# Patient Record
Sex: Female | Born: 1990 | Race: Asian | Hispanic: No | Marital: Single | Smoking: Never smoker
Health system: Southern US, Community
[De-identification: ages and names within clinical notes are randomized; demographics above are authoritative.]

## PROBLEM LIST (undated history)

## (undated) DIAGNOSIS — F32A Depression, unspecified: Secondary | ICD-10-CM

## (undated) DIAGNOSIS — F329 Major depressive disorder, single episode, unspecified: Secondary | ICD-10-CM

## (undated) HISTORY — DX: Major depressive disorder, single episode, unspecified: F32.9

## (undated) HISTORY — DX: Depression, unspecified: F32.A

---
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
## 2011-02-21 LAB — BASIC METABOLIC PANEL
BUN: 16 mg/dL (ref 6–23)
Chloride: 107 mEq/L (ref 96–112)
Creatinine, Ser: 0.92 mg/dL (ref 0.4–1.2)
GFR calc non Af Amer: >60 mL/min (ref >60)

## 2011-02-21 LAB — CBC
MCHC: 32.6 g/dL (ref 30.0–36.0)
MCV: 78.6 fL (ref 78.0–100.0)
Platelets: 219 10*3/uL (ref 150–400)
RDW: 13.8 % (ref 11.5–15.5)
WBC: 7.5 10*3/uL (ref 4.0–10.5)

## 2011-02-21 LAB — URINALYSIS, ROUTINE W REFLEX MICROSCOPIC
Bilirubin Urine: NEGATIVE
Glucose, UA: NEGATIVE mg/dL
Hgb urine dipstick: NEGATIVE
Ketones, ur: 15 mg/dL — AB
Leukocytes, UA: NEGATIVE
Nitrite: NEGATIVE
Protein, ur: 30 mg/dL — AB
Specific Gravity, Urine: 1.029 (ref 1.005–1.030)
Urobilinogen, UA: 0.2 mg/dL (ref 0.0–1.0)
pH: 6 (ref 5.0–8.0)

## 2011-02-21 LAB — HEPATIC FUNCTION PANEL
ALT: 9 U/L (ref 0–35)
AST: 17 U/L (ref 0–37)
Alkaline Phosphatase: 56 U/L (ref 39–117)
Bilirubin, Direct: 0.1 mg/dL (ref 0.0–0.3)
Total Bilirubin: 0.5 mg/dL (ref 0.3–1.2)

## 2011-02-21 LAB — URINE MICROSCOPIC-ADD ON

## 2011-02-21 LAB — SALICYLATE LEVEL: Salicylate Lvl: <4 mg/dL (ref 2.8–20.0)

## 2011-02-21 LAB — RAPID URINE DRUG SCREEN, HOSP PERFORMED
Amphetamines: NOT DETECTED
Barbiturates: NOT DETECTED
Benzodiazepines: NOT DETECTED
Cocaine: NOT DETECTED
Opiates: NOT DETECTED
Tetrahydrocannabinol: NOT DETECTED

## 2011-02-21 LAB — PREGNANCY, URINE: Preg Test, Ur: NEGATIVE

## 2011-02-21 LAB — ETHANOL: Alcohol, Ethyl (B): <5 mg/dL (ref 0–10)

## 2011-02-21 LAB — DIFFERENTIAL
Basophils Absolute: 0 10*3/uL (ref 0.0–0.1)
Basophils Relative: 0 % (ref 0–1)
Eosinophils Relative: 1 % (ref 0–5)
Lymphocytes Relative: 21 % (ref 12–46)

## 2011-02-21 LAB — TRICYCLICS SCREEN, URINE: TCA Scrn: NOT DETECTED

## 2011-02-21 LAB — RPR: RPR Ser Ql: NONREACTIVE

## 2011-02-21 LAB — GC/CHLAMYDIA PROBE AMP, URINE: Chlamydia, Swab/Urine, PCR: NEGATIVE

## 2011-02-21 LAB — ACETAMINOPHEN LEVEL: Acetaminophen (Tylenol), Serum: <10 ug/mL — ABNORMAL LOW (ref 10–30)

## 2012-03-27 ENCOUNTER — Ambulatory Visit: Payer: BC Managed Care – PPO

## 2012-03-27 ENCOUNTER — Ambulatory Visit (INDEPENDENT_AMBULATORY_CARE_PROVIDER_SITE_OTHER): Payer: BC Managed Care – PPO | Admitting: Family Medicine

## 2012-03-27 VITALS — BP 109/75 | HR 86 | Temp 98.1°F | Resp 16 | Ht 62.0 in | Wt 94.0 lb

## 2012-03-27 DIAGNOSIS — R7611 Nonspecific reaction to tuberculin skin test without active tuberculosis: Secondary | ICD-10-CM

## 2012-03-27 NOTE — Progress Notes (Signed)
  Subjective:    Patient ID: Loretta Carter, female    DOB: [birth year], 21 y.o.   MRN: [id number]  HPI 21 yo female from [place] here following positive TB test. Read yesterday at [facility] on [address].  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  Came to [place] in 2000.  Thinks she had a TB test last year and it was negative.  DEnies family members with known TB.   Feels well, no night sweats or unexplained weight loss.   Does state in the morning she will cough and does have blood in the cough.  Occurs daily for about a year.  Only in the morning, not later in the day.     Review of Systems Negative except as per HPI     Objective:   Physical Exam  Constitutional: She appears well-developed.  Cardiovascular: Normal rate, regular rhythm, normal heart sounds and intact distal pulses.   Pulmonary/Chest: Effort normal and breath sounds normal. No respiratory distress. She has no wheezes. She has no rales.  Lymphadenopathy:    She has no cervical adenopathy.  Neurological: She is alert.  Skin: Skin is warm.    [facility] Primary radiology reading by Dr. 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  Referral to health department TB clinic.  Also gave number of the c linic to the patient so she may call if she has not heard from [place] by the beginning of next week.

## 2012-03-28 NOTE — Progress Notes (Signed)
Copy mailed to pt. 

## 2012-04-29 ENCOUNTER — Ambulatory Visit (INDEPENDENT_AMBULATORY_CARE_PROVIDER_SITE_OTHER): Payer: BC Managed Care – PPO | Admitting: Obstetrics and Gynecology

## 2012-04-29 ENCOUNTER — Encounter: Payer: Self-pay | Admitting: Obstetrics and Gynecology

## 2012-04-29 VITALS — BP 98/62 | Ht 63.0 in | Wt 96.0 lb

## 2012-04-29 DIAGNOSIS — R6889 Other general symptoms and signs: Secondary | ICD-10-CM

## 2012-04-29 DIAGNOSIS — Z309 Encounter for contraceptive management, unspecified: Secondary | ICD-10-CM

## 2012-04-29 DIAGNOSIS — IMO0001 Reserved for inherently not codable concepts without codable children: Secondary | ICD-10-CM | POA: Insufficient documentation

## 2012-04-29 DIAGNOSIS — F329 Major depressive disorder, single episode, unspecified: Secondary | ICD-10-CM

## 2012-04-29 LAB — CBC
HCT: 39.4 % (ref 36.0–46.0)
Hemoglobin: 12.5 g/dL (ref 12.0–15.0)
MCH: 24.9 pg — ABNORMAL LOW (ref 26.0–34.0)
MCHC: 31.7 g/dL (ref 30.0–36.0)
MCV: 78.5 fL (ref 78.0–100.0)
RDW: 14.7 % (ref 11.5–15.5)

## 2012-04-29 NOTE — Progress Notes (Signed)
The patient reports:no complaints  Contraception: wanting Nexplanon  Last mammogram: not applicable  Last pap: not applicable   GC/Chlamydia cultures offered: requested HIV/RPR/HbsAg offered:  requested HSV 1 and 2 glycoprotein offered: requested  Menstrual cycle regular and monthly: Yes Menstrual flow normal: Yes  Urinary symptoms: none Normal bowel movements: Yes Reports abuse at home: No

## 2012-04-29 NOTE — Patient Instructions (Signed)
Contraceptive Implant Information A contraceptive implant is a plastic rod that is inserted under the skin. It is usually inserted under the skin of your upper arm. It continually releases small amounts of progestin (synthetic progesterone) into the bloodstream. This prevents an egg from being released from the ovary. It also thickens the cervical mucus to prevent sperm from entering the cervix, and it thins the uterine lining to prevent a fertilized egg from attaching to the uterus. They can be effective for up to 3 years. Implants do not provide protection against sexually transmitted diseases (STDs).  The procedure to insert an implant usually takes about 10 minutes. There may be minor bruising, swelling, and discomfort at the insertion site for a couple days. The implant begins to work within the first day. Other contraceptive protection should be used for 2 weeks. Follow up with your caregiver to get rechecked as directed. Your caregiver will make sure you are a good candidate for the contraceptive implant. Discuss with your caregiver the possible side effects of the implant ADVANTAGES  It prevents pregnancy for up to 3 years.   It is easily reversible.   It is convenient.   The progestins may protect against uterine and ovarian cancer.   It can be used when breastfeeding.   It can be used by women who cannot take estrogen.  DISADVANTAGES  You may have irregular or unplanned vaginal bleeding.   You may develop side effects, including headache, weight gain, acne, breast tenderness, or mood changes.   You may have tissue or nerve damage after insertion (rare).   It may be difficult and uncomfortable to remove.   Certain medications may interfere with the effectiveness of the implants.  REMOVAL OF IMPLANT The implant should be removed in 3 years or as directed by your caregiver. The implants effect wears off in a few hours after removal. Your ability to get pregnant (fertility) is  restored within a couple of weeks. New implants can be inserted as soon as the old ones are removed if desired. DO NOT GET THE IMPLANT IF:   You are pregnant.   You have a history of breast cancer, osteoporosis, blood clots, heart disease, diabetes, high blood pressure, liver disease, tumors, or stroke.    You have undiagnosed vaginal bleeding.   You have overly sensitive to certain parts of the implant.  Document Released: 11/15/2011 Document Reviewed: 11/13/2011 [employer] Patient Information 2012 [employer]. 

## 2012-04-29 NOTE — Progress Notes (Signed)
Subjective:    Loretta Carter is a 21 y.o. female, P0 who presents for an annual exam and to discuss Nexplanon for contraception   History   Social History  . Marital Status: Single    Spouse Name: N/A    Number of Children: N/A  . Years of Education: N/A   Social History Main Topics  . Smoking status: Never Smoker   . Smokeless tobacco: Never Used  . Alcohol Use: No  . Drug Use: No  . Sexually Active: Yes    Birth Control/ Protection: None   Other Topics Concern  . None   Social History Narrative  . None    Menstrual cycle:   LMP: Patient's last menstrual period was 04/25/2012.           Cycle: Regular, monthly with normal flow and no severe dysmenorrha  The following portions of the patient's history were reviewed and updated as appropriate: allergies, current medications, past family history, past medical history, past social history, past surgical history and problem list.  Review of Systems Pertinent items are noted in HPI. Breast:Negative for breast lump,nipple discharge or nipple retraction Gastrointestinal: Negative for abdominal pain, change in bowel habits or rectal bleeding Urinary:negative Is sexually active Denies abuse   Objective:    BP 98/62  Ht 5\' 3"  (1.6 m)  Wt 96 lb (43.545 kg)  BMI 17.01 kg/m2  LMP 04/25/2012    Weight:  Wt Readings from Last 1 Encounters:  04/29/12 96 lb (43.545 kg)          BMI: Body mass index is 17.01 kg/(m^2).  General Appearance: Alert, appropriate appearance for age. No acute distress HEENT: Grossly normal Neck / Thyroid: Supple, no masses, nodes or enlargement Lungs: clear to auscultation bilaterally Back: No CVA tenderness Breast Exam: No masses or nodes.No dimpling, nipple retraction or discharge. Cardiovascular: Regular rate and rhythm. S1, S2, no murmur Gastrointestinal: Soft, non-tender, no masses or organomegaly Pelvic Exam: Exam deferred. pt having menses, and requests deferral of pelvic Rectovaginal: not  indicated Lymphatic Exam: Non-palpable nodes in neck, clavicular, axillary, or inguinal regions Skin: no rash or abnormalities Neurologic: Normal gait and speech, no tremor  Psychiatric: Alert and oriented, appropriate affect. Has hx of depression in 2011. Denies suicidal or homicidal ideation  Wet Prep:no sx Urinalysis:not applicable UPT: Not done   Assessment:    Contraceptive management    Plan:    pap smear at age 8 additional lab tests per orders, CBC, vitD STD screening: gc/chl on urine.  HIV, RPR Contraception:REQUESTS Nexplanon, will precert      Chasten Blaze PMD

## 2012-04-30 LAB — RPR

## 2012-05-13 ENCOUNTER — Telehealth: Payer: Self-pay

## 2012-05-13 NOTE — Telephone Encounter (Signed)
Tc to pt per GC/Ct that was not performed at last visit. Rec testing be performed per vph. Pt aware HIV and RPR-both neg. Pt agrees. Will have done at appt sched 05/15/12 with ep.

## 2012-05-15 ENCOUNTER — Ambulatory Visit (INDEPENDENT_AMBULATORY_CARE_PROVIDER_SITE_OTHER): Payer: BC Managed Care – PPO | Admitting: Obstetrics and Gynecology

## 2012-05-15 ENCOUNTER — Encounter: Payer: Self-pay | Admitting: Obstetrics and Gynecology

## 2012-05-15 VITALS — BP 90/60 | HR 60 | Ht 63.0 in | Wt 98.0 lb

## 2012-05-15 DIAGNOSIS — Z113 Encounter for screening for infections with a predominantly sexual mode of transmission: Secondary | ICD-10-CM

## 2012-05-15 DIAGNOSIS — Z30017 Encounter for initial prescription of implantable subdermal contraceptive: Secondary | ICD-10-CM

## 2012-05-15 DIAGNOSIS — IMO0001 Reserved for inherently not codable concepts without codable children: Secondary | ICD-10-CM

## 2012-05-15 DIAGNOSIS — Z309 Encounter for contraceptive management, unspecified: Secondary | ICD-10-CM

## 2012-05-15 LAB — POCT URINE PREGNANCY: Preg Test, Ur: NEGATIVE

## 2012-05-15 MED ORDER — ETONOGESTREL 68 MG ~~LOC~~ IMPL
68.0000 mg | DRUG_IMPLANT | Freq: Once | SUBCUTANEOUS | Status: AC
  Administered 2012-05-15: 68 mg via SUBCUTANEOUS

## 2012-05-15 NOTE — Progress Notes (Signed)
21 YO for Nexplanon insertion.  Patient states she has researched the Nexplanon and has no questions except how it works-advised ovulation prevention.   O: Nexplanon inserted per protocol in medial left upper arm without difficulty.  Rod palpated.  Dressed with sterile band-aid and 4 x 4 gauze with Kling pressure bandage.  UPT-negative  A: Nexplanon Insertion  P:Reviewed wound care & S/S of infection    RTO-1 week  Joseh Sjogren, PA-C

## 2012-05-15 NOTE — Patient Instructions (Signed)
Follow up in 1 week  Call [facility] [phone]:  -for temperature of 100.4 degrees Fahrenheit or more -pain not improved with over the counter pain medications (Ibuprofen, Advil, Aleve,     Tylenol or acetaminophen) -for excessive bleeding from insertion site -for excessive swelling redness or green drainage from your insertion site -for any other concerns  Use a back-up method of birth control for the next 4 weeks

## 2012-05-16 LAB — GC/CHLAMYDIA PROBE AMP, URINE
Chlamydia, Swab/Urine, PCR: NEGATIVE
GC Probe Amp, Urine: NEGATIVE

## 2012-05-20 ENCOUNTER — Encounter: Payer: Self-pay | Admitting: Obstetrics and Gynecology

## 2012-05-20 ENCOUNTER — Ambulatory Visit (INDEPENDENT_AMBULATORY_CARE_PROVIDER_SITE_OTHER): Payer: BC Managed Care – PPO | Admitting: Obstetrics and Gynecology

## 2012-05-20 VITALS — BP 106/60 | Ht 63.0 in | Wt 99.0 lb

## 2012-05-20 DIAGNOSIS — IMO0001 Reserved for inherently not codable concepts without codable children: Secondary | ICD-10-CM

## 2012-05-20 DIAGNOSIS — Z309 Encounter for contraceptive management, unspecified: Secondary | ICD-10-CM

## 2012-05-20 NOTE — Progress Notes (Signed)
LMP: 05/17/201 INSERTION DATE: 05/15/2012 INSERTION ARM: left arm.  Pt c/o pain and swelling at insertion site. No bleeding from sight. Pain is improving. No fever or abdominal pain.no vaginal bleeding BP 106/60  Ht 5\' 3"  (1.6 m)  Wt 99 lb (44.906 kg)  BMI 17.54 kg/m2  LMP 04/25/2012 Left arm with palpable implant surrounded by ecchymosis in varying stages of healing Insertion site puncture healing well.  No drainage or purulence Appropriate tenderness around  implant which is palpable.  Patient request results from her last blood tests which included a hemoglobin which was normal and vitamin D level which was normal as well.  Impression: Status post placement of Nexplanon with appropriate recovery course  Recommendation: Patient will followup at annual examination or as needed.

## 2012-09-17 IMAGING — CR DG CHEST 2V
2 series · 2 of 2 positions shown · non-contrast
Comparison: None.

CLINICAL DATA: Positive PPD.

CHEST - 2 VIEW

[PA]
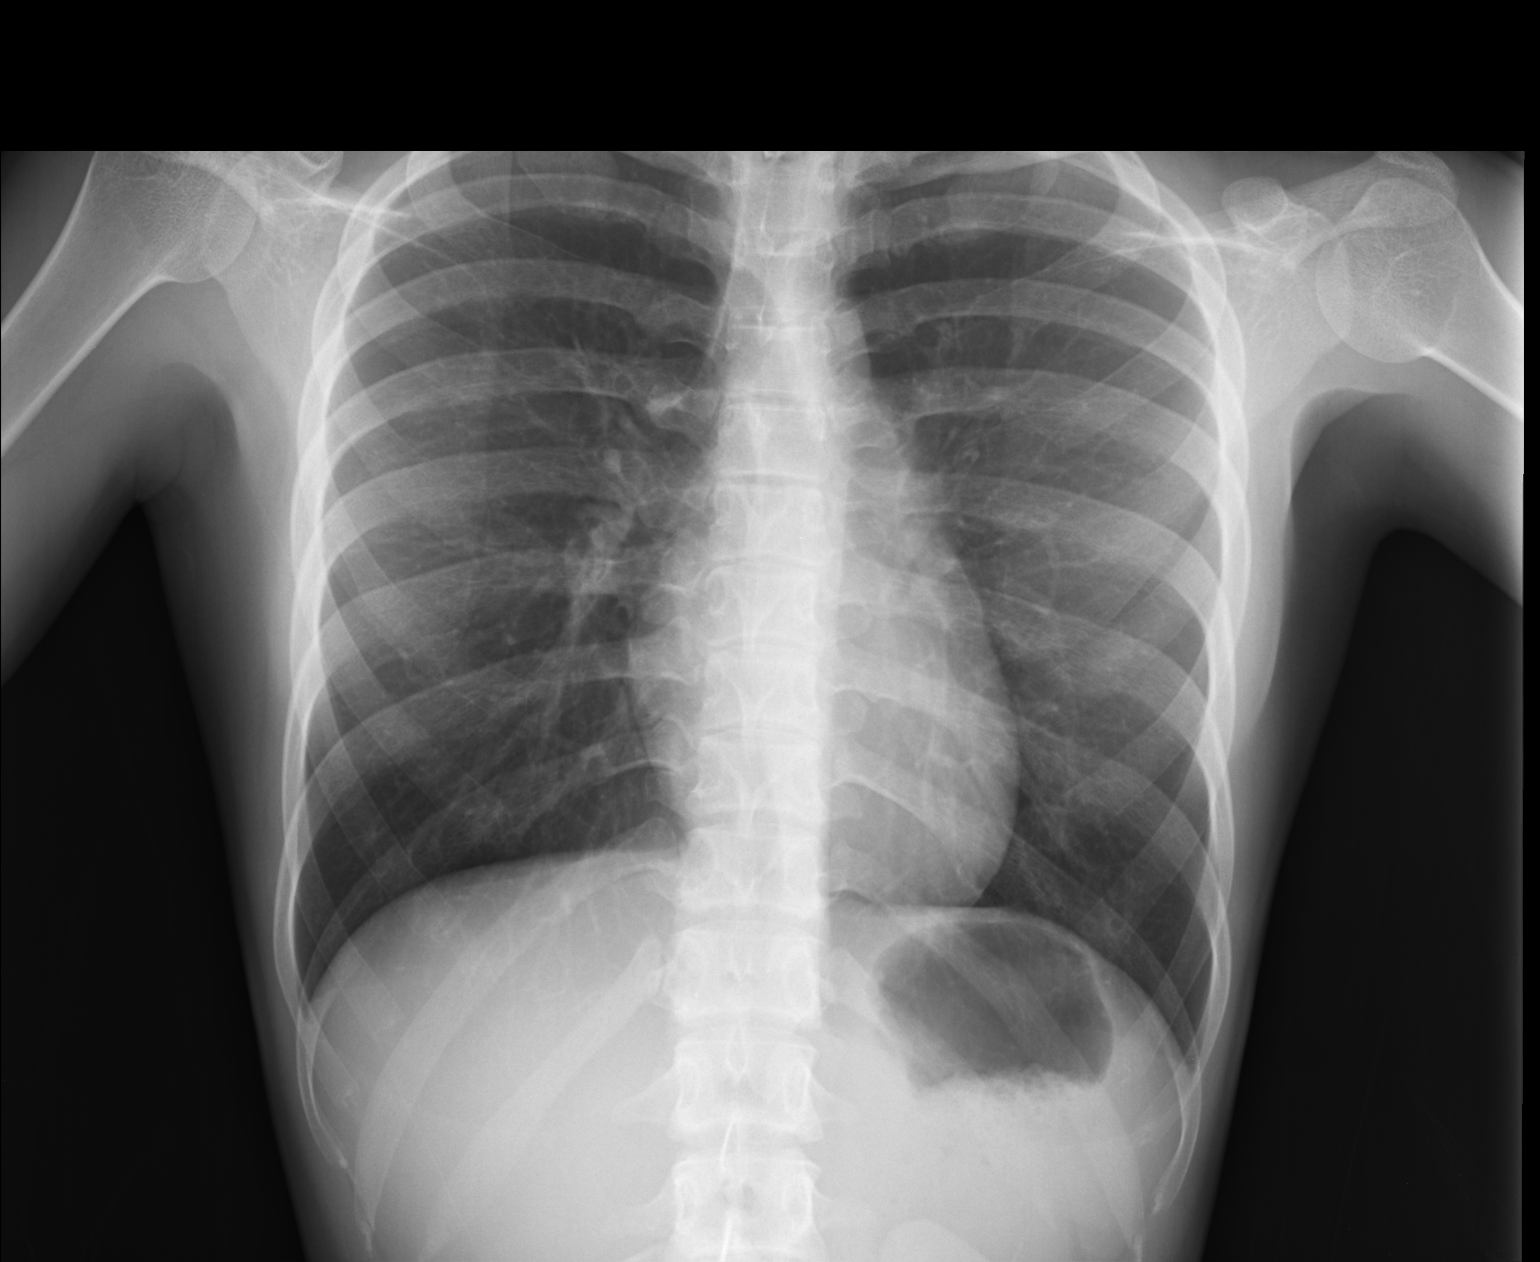

[lateral]
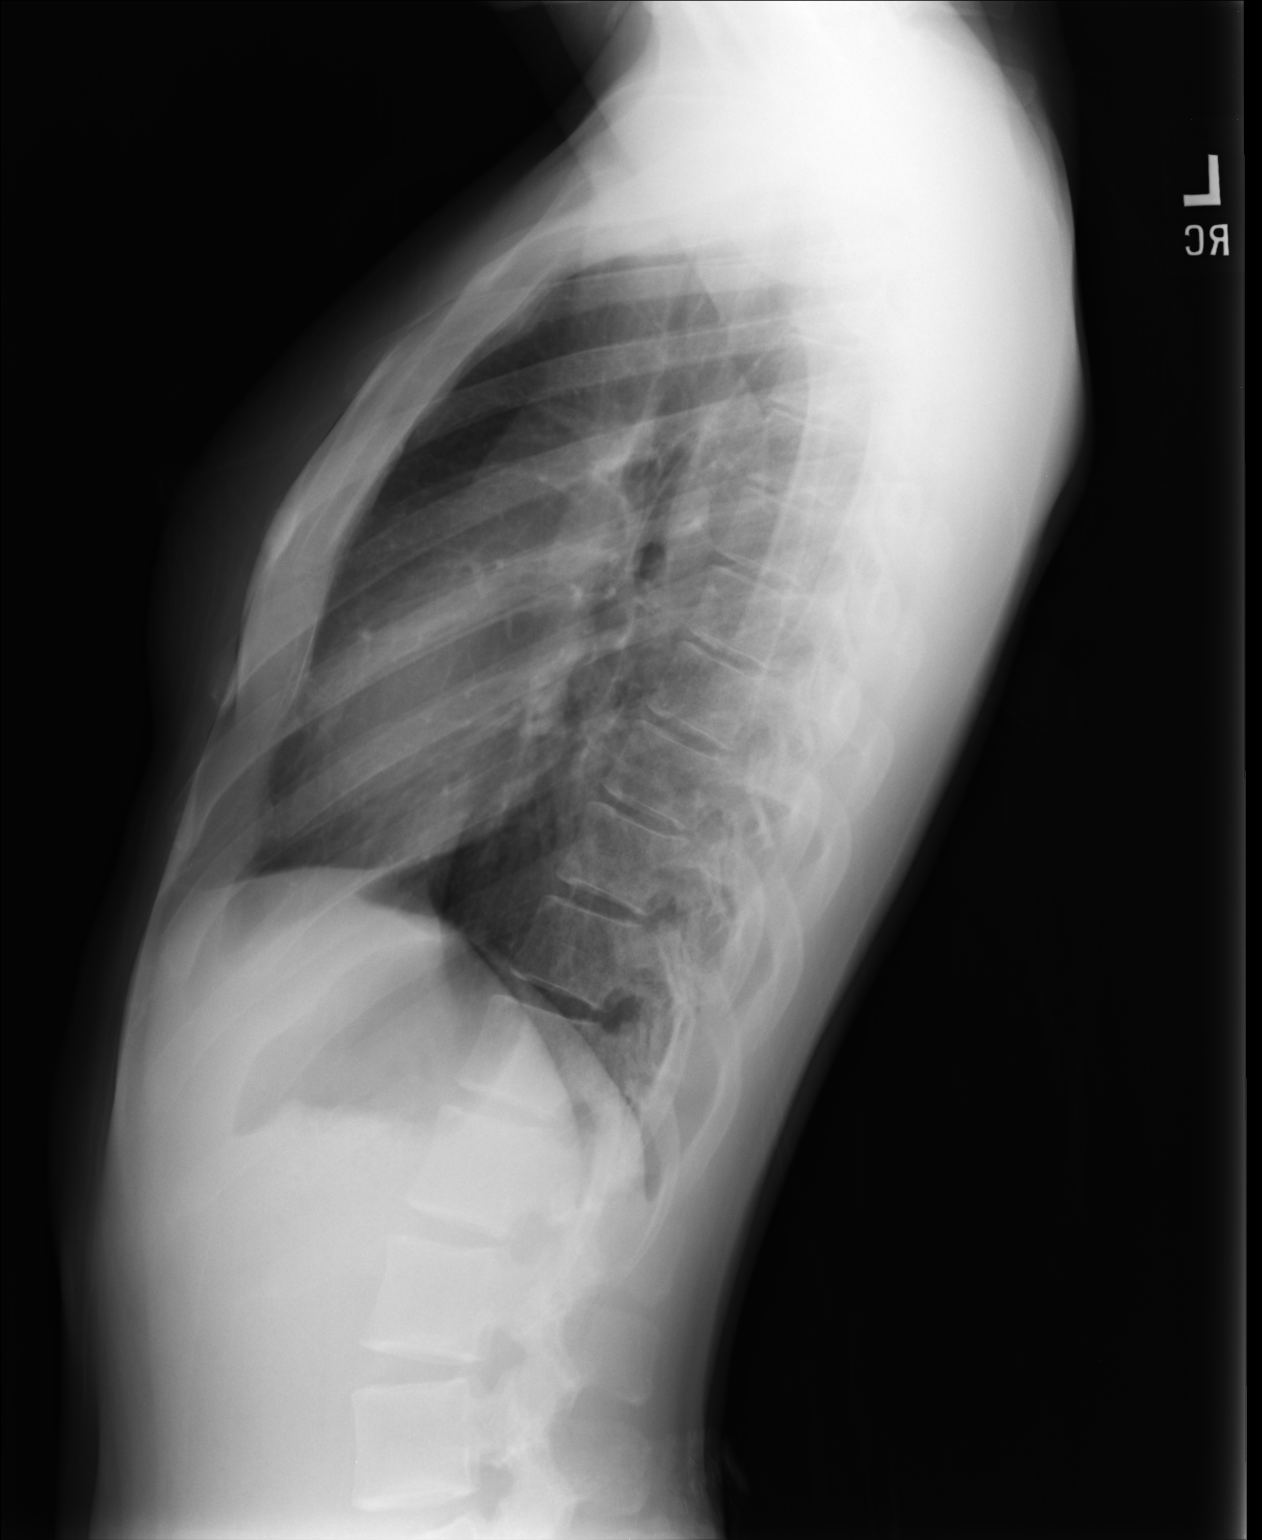

[2 of 2 positions shown; findings below may reference images not displayed]

FINDINGS: Lungs are clear.  Heart size is normal.  No pneumothorax
or pleural effusion.  No focal bony abnormality.
IMPRESSION: Normal chest.

Clinically significant discrepancy from primary report, if
provided: None

## 2012-10-02 ENCOUNTER — Encounter: Payer: Self-pay | Admitting: Obstetrics and Gynecology

## 2012-10-02 ENCOUNTER — Ambulatory Visit (INDEPENDENT_AMBULATORY_CARE_PROVIDER_SITE_OTHER): Payer: BC Managed Care – PPO | Admitting: Obstetrics and Gynecology

## 2012-10-02 VITALS — BP 100/60 | HR 60 | Wt 100.0 lb

## 2012-10-02 DIAGNOSIS — N926 Irregular menstruation, unspecified: Secondary | ICD-10-CM

## 2012-10-02 DIAGNOSIS — Z3046 Encounter for surveillance of implantable subdermal contraceptive: Secondary | ICD-10-CM

## 2012-10-02 LAB — POCT URINE PREGNANCY: Preg Test, Ur: NEGATIVE

## 2012-10-02 MED ORDER — ESTRADIOL 1 MG PO TABS: 1.0000 mg | ORAL_TABLET | Freq: Every day | ORAL | Status: DC

## 2012-10-02 NOTE — Progress Notes (Signed)
21 YO with Nexplanon insertion 05/15/12 for removal due to irregular bleeding.  Has had to change 1 pad daily since insertion-no cramps.  Prior to Nexplanon menses lasted 5-7 days with pad change twice a day and minimal cramps.  Patient given handout on contraceptive methods and verbal overview given of each. Patient wants Depo Provera.  Reviewed MOA, effectiveness along with potential side effects to include the one she is currently experiencing.  Advised that Calcium is recommended with Depo Provera.  Offered a trial of estradiol to manage bleeding before removal of Nexplanon-patient agreeable.   O: UPT-negative  A: Irregular Bleeding-Nexplanon  P:  Estradiol 1 mg  #14  1 po qd no refills       If estradiol fails patient to schedule Nexplanon removal, have      Depo Provera ordered at pharmacy and bring in to be given      after removal.      RTO-as scheduled or prn  Loretta Delia, PA-C

## 2012-12-07 ENCOUNTER — Ambulatory Visit: Payer: BC Managed Care – PPO | Admitting: Family Medicine

## 2012-12-07 VITALS — BP 121/86 | HR 97 | Temp 98.1°F | Resp 18 | Ht 62.0 in | Wt 99.6 lb

## 2012-12-07 DIAGNOSIS — J029 Acute pharyngitis, unspecified: Secondary | ICD-10-CM

## 2012-12-07 DIAGNOSIS — H60399 Other infective otitis externa, unspecified ear: Secondary | ICD-10-CM

## 2012-12-07 DIAGNOSIS — H6090 Unspecified otitis externa, unspecified ear: Secondary | ICD-10-CM

## 2012-12-07 LAB — POCT INFLUENZA A/B: Influenza A, POC: NEGATIVE

## 2012-12-07 MED ORDER — HYDROCODONE-HOMATROPINE 5-1.5 MG/5ML PO SYRP: 5.0000 mL | ORAL_SOLUTION | ORAL | Status: DC | PRN

## 2012-12-07 MED ORDER — AMOXICILLIN-POT CLAVULANATE 875-125 MG PO TABS: 1.0000 | ORAL_TABLET | Freq: Two times a day (BID) | ORAL | Status: DC

## 2012-12-07 MED ORDER — OFLOXACIN 0.3 % OT SOLN: 5.0000 [drp] | Freq: Two times a day (BID) | OTIC | Status: DC

## 2012-12-07 NOTE — Progress Notes (Signed)
Subjective:    Patient ID: Loretta Carter, female    DOB: May 12, 1991, 21 y.o.   MRN: [id number]  HPI Sxs start Thursday (4d prev) with sore throat and it progressed and developed a lot of lung congestion and cough then developed nasal congesiton, HAs, myalgias/arthralgias, lots of ear pain, sinus pain, no dental pain, no n/v but appetite decreased, not urinating as much, no cp but is having palpitations.  Past Medical History  Diagnosis Date  . Asthma   . Depression    Current Outpatient Prescriptions on File Prior to Visit  Medication Sig Dispense Refill  . etonogestrel (NEXPLANON) 68 MG IMPL implant Inject 1 each into the skin once.      . Multiple Vitamin (MULTIVITAMIN) tablet Take 1 tablet by mouth daily.      Marland Kitchen estradiol (ESTRACE) 1 MG tablet Take 1 tablet (1 mg total) by mouth daily.  14 tablet  0   No Known Allergies   Review of Systems  Constitutional: Positive for chills, activity change, appetite change and fatigue. Negative for fever and diaphoresis.  HENT: Positive for ear pain, congestion, sore throat, rhinorrhea, trouble swallowing, postnasal drip and ear discharge. Negative for nosebleeds, sneezing, mouth sores, neck pain, neck stiffness, voice change and sinus pressure.   Eyes: Negative for photophobia and pain.  Respiratory: Positive for cough and shortness of breath.   Cardiovascular: Positive for palpitations. Negative for chest pain.  Gastrointestinal: Negative for nausea, vomiting, abdominal pain and constipation.  Genitourinary: Positive for decreased urine volume. Negative for dysuria and frequency.  Musculoskeletal: Positive for myalgias and arthralgias. Negative for joint swelling and gait problem.  Skin: Negative for rash.  Neurological: Positive for headaches. Negative for dizziness, syncope and light-headedness.  Hematological: Negative for adenopathy.  Psychiatric/Behavioral: Positive for sleep disturbance.      BP 121/86  Pulse 97  Temp 98.1 F (36.7  C) (Oral)  Resp 18  Ht 5\' 2"  (1.575 m)  Wt 99 lb 9.6 oz (45.178 kg)  BMI 18.22 kg/m2  SpO2 98% Objective:   Physical Exam  Constitutional: She is oriented to person, place, and time. She appears well-developed and well-nourished. No distress.  HENT:  Head: Normocephalic and atraumatic.  Right Ear: Tympanic membrane, external ear and ear canal normal.  Left Ear: Ear canal normal. There is drainage and tenderness. Tympanic membrane is perforated.  Nose: Mucosal edema and rhinorrhea present.  Mouth/Throat: Uvula is midline, oropharynx is clear and moist and mucous membranes are normal. No oropharyngeal exudate.  Eyes: Conjunctivae normal are normal. Right eye exhibits no discharge. Left eye exhibits no discharge. No scleral icterus.  Neck: Normal range of motion. Neck supple.  Cardiovascular: Normal rate, regular rhythm, normal heart sounds and intact distal pulses.   Pulmonary/Chest: Effort normal and breath sounds normal. No respiratory distress.  Abdominal: Soft. Bowel sounds are normal. She exhibits no distension and no mass. There is no tenderness. There is no rebound and no guarding.  Lymphadenopathy:    She has no cervical adenopathy.  Neurological: She is alert and oriented to person, place, and time.  Skin: Skin is warm and dry. She is not diaphoretic. No erythema.  Psychiatric: She has a normal mood and affect. Her behavior is normal.      Results for orders placed in visit on 12/07/12  POCT INFLUENZA A/B      Component Value Range   Influenza A, POC Negative     Influenza B, POC Negative      Assessment & Plan:  1. Pharyngitis - PUSH FLUIDS. Out of work x 4d POCT Influenza A/B, HYDROcodone-homatropine (HYCODAN) 5-1.5 MG/5ML syrup, amoxicillin-clavulanate (AUGMENTIN) 875-125 MG per tablet  2. Otitis externa w/ concern for TM rupture Start ofloxacin drops and treat for ear infection w/ augmentin. No water into ear - keep covered w/ cotton ball. F/u in 2 wks for recheck to  see if pt does indeed have a TM rupture - if persists or hearing loss, cons ENT referral

## 2013-02-08 ENCOUNTER — Ambulatory Visit: Payer: BC Managed Care – PPO | Admitting: Emergency Medicine

## 2013-02-08 VITALS — BP 116/79 | HR 79 | Temp 98.8°F | Resp 24 | Ht 62.0 in | Wt 99.0 lb

## 2013-02-08 DIAGNOSIS — J309 Allergic rhinitis, unspecified: Secondary | ICD-10-CM

## 2013-02-08 DIAGNOSIS — J029 Acute pharyngitis, unspecified: Secondary | ICD-10-CM

## 2013-02-08 DIAGNOSIS — J020 Streptococcal pharyngitis: Secondary | ICD-10-CM

## 2013-02-08 MED ORDER — CETIRIZINE HCL 10 MG PO TABS: 10.0000 mg | ORAL_TABLET | Freq: Every day | ORAL | Status: DC

## 2013-02-08 MED ORDER — CEPHALEXIN 500 MG PO TABS: 500.0000 mg | ORAL_TABLET | Freq: Two times a day (BID) | ORAL | Status: DC

## 2013-02-08 MED ORDER — FLUTICASONE PROPIONATE 50 MCG/ACT NA SUSP: 2.0000 | Freq: Every day | NASAL | Status: DC

## 2013-02-08 NOTE — Progress Notes (Signed)
  Subjective:    Patient ID: Loretta Carter, female    DOB: [birth year], 22 y.o.   MRN: [id number]  HPI Patient presents today with complaints that started initially in December 2013. She was treated by Dr. Clelia Croft for a URI and the symptoms have persisted. Coughing like she has something stuck in her throat, she has mucus draining. The sputum has color to it. Her ears sometimes hurt. She complains that most of the congestion feeling is in her head, no complaints of chest congestion.  She states she gets sick more often due to her weak immune system. She has not been diagnosed with anything related to a weakened immunity. She is not a smoker. She has a dog at home for 2 years. The dog sleeps with her at night.    Review of Systems     Objective:   Physical Exam  Throat is normal. Impacted wisdom teeth noted. Right ear is normal, Left ear is normal.       Assessment & Plan:  1. Wash bedding, do not allow dog to sleep in bed with you.  2. Nasal Spray to open nasal cavity. 3. Antibiotic

## 2013-02-08 NOTE — Patient Instructions (Signed)
Sinusitis Sinusitis is redness, soreness, and swelling (inflammation) of the paranasal sinuses. Paranasal sinuses are air pockets within the bones of your face (beneath the eyes, the middle of the forehead, or above the eyes). In healthy paranasal sinuses, mucus is able to drain out, and air is able to circulate through them by way of your nose. However, when your paranasal sinuses are inflamed, mucus and air can become trapped. This can allow bacteria and other germs to grow and cause infection. Sinusitis can develop quickly and last only a short time (acute) or continue over a long period (chronic). Sinusitis that lasts for more than 12 weeks is considered chronic.  CAUSES  Causes of sinusitis include:  Allergies.  Structural abnormalities, such as displacement of the cartilage that separates your nostrils (deviated septum), which can decrease the air flow through your nose and sinuses and affect sinus drainage.  Functional abnormalities, such as when the small hairs (cilia) that line your sinuses and help remove mucus do not work properly or are not present. SYMPTOMS  Symptoms of acute and chronic sinusitis are the same. The primary symptoms are pain and pressure around the affected sinuses. Other symptoms include:  Upper toothache.  Earache.  Headache.  Bad breath.  Decreased sense of smell and taste.  A cough, which worsens when you are lying flat.  Fatigue.  Fever.  Thick drainage from your nose, which often is green and may contain pus (purulent).  Swelling and warmth over the affected sinuses. DIAGNOSIS  Your caregiver will perform a physical exam. During the exam, your caregiver may:  Look in your nose for signs of abnormal growths in your nostrils (nasal polyps).  Tap over the affected sinus to check for signs of infection.  View the inside of your sinuses (endoscopy) with a special imaging device with a light attached (endoscope), which is inserted into your  sinuses. If your caregiver suspects that you have chronic sinusitis, one or more of the following tests may be recommended:  Allergy tests.  Nasal culture A sample of mucus is taken from your nose and sent to a lab and screened for bacteria.  Nasal cytology A sample of mucus is taken from your nose and examined by your caregiver to determine if your sinusitis is related to an allergy. TREATMENT  Most cases of acute sinusitis are related to a viral infection and will resolve on their own within 10 days. Sometimes medicines are prescribed to help relieve symptoms (pain medicine, decongestants, nasal steroid sprays, or saline sprays).  However, for sinusitis related to a bacterial infection, your caregiver will prescribe antibiotic medicines. These are medicines that will help kill the bacteria causing the infection.  Rarely, sinusitis is caused by a fungal infection. In theses cases, your caregiver will prescribe antifungal medicine. For some cases of chronic sinusitis, surgery is needed. Generally, these are cases in which sinusitis recurs more than 3 times per year, despite other treatments. HOME CARE INSTRUCTIONS   Drink plenty of water. Water helps thin the mucus so your sinuses can drain more easily.  Use a humidifier.  Inhale steam 3 to 4 times a day (for example, sit in the bathroom with the shower running).  Apply a warm, moist washcloth to your face 3 to 4 times a day, or as directed by your caregiver.  Use saline nasal sprays to help moisten and clean your sinuses.  Take over-the-counter or prescription medicines for pain, discomfort, or fever only as directed by your caregiver. SEEK IMMEDIATE MEDICAL   CARE IF:  You have increasing pain or severe headaches.  You have nausea, vomiting, or drowsiness.  You have swelling around your face.  You have vision problems.  You have a stiff neck.  You have difficulty breathing. MAKE SURE YOU:   Understand these  instructions.  Will watch your condition.  Will get help right away if you are not doing well or get worse. Document Released: 11/26/2005 Document Revised: 02/18/2012 Document Reviewed: 12/11/2011 [facility] Patient Information 2013 [place], [place]. Allergies, Generic Allergies may happen from anything your body is sensitive to. This may be food, medicines, pollens, chemicals, and nearly anything around you in everyday life that produces allergens. An allergen is anything that causes an allergy producing substance. Heredity is often a factor in causing these problems. This means you may have some of the same allergies as your parents. Food allergies happen in all age groups. Food allergies are some of the most severe and life threatening. Some common food allergies are cow's milk, seafood, eggs, nuts, wheat, and soybeans. SYMPTOMS   Swelling around the mouth.  An itchy red rash or hives.  Vomiting or diarrhea.  Difficulty breathing. SEVERE ALLERGIC REACTIONS ARE LIFE-THREATENING. This reaction is called anaphylaxis. It can cause the mouth and throat to swell and cause difficulty with breathing and swallowing. In severe reactions only a trace amount of food (for example, peanut oil in a salad) may cause death within seconds. Seasonal allergies occur in all age groups. These are seasonal because they usually occur during the same season every year. They may be a reaction to molds, grass pollens, or tree pollens. Other causes of problems are house dust mite allergens, pet dander, and mold spores. The symptoms often consist of nasal congestion, a runny itchy nose associated with sneezing, and tearing itchy eyes. There is often an associated itching of the mouth and ears. The problems happen when you come in contact with pollens and other allergens. Allergens are the particles in the air that the body reacts to with an allergic reaction. This causes you to release allergic antibodies. Through a chain  of events, these eventually cause you to release histamine into the blood stream. Although it is meant to be protective to the body, it is this release that causes your discomfort. This is why you were given anti-histamines to feel better. If you are unable to pinpoint the offending allergen, it may be determined by skin or blood testing. Allergies cannot be cured but can be controlled with medicine. Hay fever is a collection of all or some of the seasonal allergy problems. It may often be treated with simple over-the-counter medicine such as diphenhydramine. Take medicine as directed. Do not drink alcohol or drive while taking this medicine. Check with your caregiver or package insert for child dosages. If these medicines are not effective, there are many new medicines your caregiver can prescribe. Stronger medicine such as nasal spray, eye drops, and corticosteroids may be used if the first things you try do not work well. Other treatments such as immunotherapy or desensitizing injections can be used if all else fails. Follow up with your caregiver if problems continue. These seasonal allergies are usually not life threatening. They are generally more of a nuisance that can often be handled using medicine. HOME CARE INSTRUCTIONS   If unsure what causes a reaction, keep a diary of foods eaten and symptoms that follow. Avoid foods that cause reactions.  If hives or rash are present:  Take medicine as directed.  You may use an over-the-counter antihistamine (diphenhydramine) for hives and itching as needed.  Apply cold compresses (cloths) to the skin or take baths in cool water. Avoid hot baths or showers. Heat will make a rash and itching worse.  If you are severely allergic:  Following a treatment for a severe reaction, hospitalization is often required for closer follow-up.  Wear a medic-alert bracelet or necklace stating the allergy.  You and your family must learn how to give adrenaline or  use an anaphylaxis kit.  If you have had a severe reaction, always carry your anaphylaxis kit or EpiPen with you. Use this medicine as directed by your caregiver if a severe reaction is occurring. Failure to do so could have a fatal outcome. SEEK MEDICAL CARE IF:  You suspect a food allergy. Symptoms generally happen within 30 minutes of eating a food.  Your symptoms have not gone away within 2 days or are getting worse.  You develop new symptoms.  You want to retest yourself or your child with a food or drink you think causes an allergic reaction. Never do this if an anaphylactic reaction to that food or drink has happened before. Only do this under the care of a caregiver. SEEK IMMEDIATE MEDICAL CARE IF:   You have difficulty breathing, are wheezing, or have a tight feeling in your chest or throat.  You have a swollen mouth, or you have hives, swelling, or itching all over your body.  You have had a severe reaction that has responded to your anaphylaxis kit or an EpiPen. These reactions may return when the medicine has worn off. These reactions should be considered life threatening. MAKE SURE YOU:   Understand these instructions.  Will watch your condition.  Will get help right away if you are not doing well or get worse. Document Released: 02/19/2003 Document Revised: 02/18/2012 Document Reviewed: 07/26/2008 [facility] Patient Information 2013 [place], [place].

## 2014-09-13 ENCOUNTER — Ambulatory Visit (INDEPENDENT_AMBULATORY_CARE_PROVIDER_SITE_OTHER): Payer: 59 | Admitting: Family Medicine

## 2014-09-13 VITALS — BP 108/70 | HR 80 | Temp 97.9°F | Resp 16 | Ht 62.0 in | Wt 108.0 lb

## 2014-09-13 DIAGNOSIS — R05 Cough: Secondary | ICD-10-CM

## 2014-09-13 DIAGNOSIS — R059 Cough, unspecified: Secondary | ICD-10-CM

## 2014-09-13 DIAGNOSIS — J069 Acute upper respiratory infection, unspecified: Secondary | ICD-10-CM

## 2014-09-13 MED ORDER — BENZONATATE 100 MG PO CAPS: 100.0000 mg | ORAL_CAPSULE | Freq: Three times a day (TID) | ORAL | Status: DC | PRN

## 2014-09-13 MED ORDER — HYDROCODONE-HOMATROPINE 5-1.5 MG/5ML PO SYRP: 5.0000 mL | ORAL_SOLUTION | Freq: Three times a day (TID) | ORAL | Status: DC | PRN

## 2014-09-13 NOTE — Progress Notes (Signed)
[facility] [address], [place] [address] [phone]  Date:  09/13/2014   Name:  Loretta Carter   DOB:  August 07, 1991   MRN:  [id number]  PCP:  Hal Morales, MD    Chief Complaint: Cough, Sore Throat and Nasal Congestion   History of Present Illness:  Loretta Carter is a 23 y.o. very pleasant female patient who presents with the following:  She has been sick for about 5 days with nasal congestion, sore throat, headache, and cough.  Some earache as well.  The cough is productive.   She has felt warm, but did not measure her fever.   She has not noted body aches, no GI sx however.  She is generally in good health, she has nexplanon.  She has tried robitussion and mucinex OTC; last dose of medication last night.   No sick contacts known.   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    Patient Active Problem List   Diagnosis Date Noted  . Contraception 04/29/2012  . Temperature intolerance 04/29/2012    Past Medical History  Diagnosis Date  . Asthma   . Depression     No past surgical history on file.  History  Substance Use Topics  . Smoking status: Never Smoker   . Smokeless tobacco: Never Used  . Alcohol Use: No    Family History  Problem Relation Age of Onset  . Stroke Maternal Grandfather   . Hypertension Maternal Grandfather   . Hypertension Father     No Known Allergies  Medication list has been reviewed and updated.  Current Outpatient Prescriptions on File Prior to Visit  Medication Sig Dispense Refill  . estradiol (ESTRACE) 1 MG tablet Take 1 tablet (1 mg total) by mouth daily.  14 tablet  0  . etonogestrel (NEXPLANON) 68 MG IMPL implant Inject 1 each into the skin once.      . Multiple Vitamin (MULTIVITAMIN) tablet Take 1 tablet by mouth daily.      Marland Kitchen amoxicillin-clavulanate (AUGMENTIN) 875-125 MG per tablet Take 1 tablet by mouth 2 (two) times daily.  20 tablet  0  . Cephalexin 500 MG tablet Take 1 tablet (500  mg total) by mouth 2 (two) times daily.  20 tablet  0  . cetirizine (ZYRTEC) 10 MG tablet Take 1 tablet (10 mg total) by mouth daily.  30 tablet  11  . fluticasone (FLONASE) 50 MCG/ACT nasal spray Place 2 sprays into the nose daily.  16 g  6  . HYDROcodone-homatropine (HYCODAN) 5-1.5 MG/5ML syrup Take 5 mLs by mouth every 4 (four) hours as needed for cough or pain.  240 mL  1  . ofloxacin (FLOXIN OTIC) 0.3 % otic solution Place 5 drops into the left ear 2 (two) times daily.  5 mL  0   No current facility-administered medications on file prior to visit.    Review of Systems:  As per HPI- otherwise negative.   Physical Examination: Filed Vitals:   09/13/14 1230  BP: 108/70  Pulse: 80  Temp: 97.9 F (36.6 C)  Resp: 16   Filed Vitals:   09/13/14 1230  Height: 5\' 2"  (1.575 m)  Weight: 108 lb (48.988 kg)   Body mass index is 19.75 kg/(m^2). Ideal Body Weight: Weight in (lb) to have BMI = 25: 136.4  GEN: WDWN, NAD, Non-toxic, A & O x 3, looks well HEENT: Atraumatic, Normocephalic. Neck supple. No masses, No LAD. Bilateral  TM wnl, oropharynx normal.  PEERL,EOMI.   Nasal cavity congestion Ears and Nose: No external deformity. CV: RRR, No M/G/R. No JVD. No thrill. No extra heart sounds. PULM: CTA B, no wheezes, crackles, rhonchi. No retractions. No resp. distress. No accessory muscle use. EXTR: No c/c/e NEURO Normal gait.  PSYCH: Normally interactive. Conversant. Not depressed or anxious appearing.  Calm demeanor.    Assessment and Plan: Cough - Plan: benzonatate (TESSALON) 100 MG capsule, HYDROcodone-homatropine (HYCODAN) 5-1.5 MG/5ML syrup  Viral URI  Treat for viral URI- See patient instructions for more details.     Signed Abbe AmsterdamJessica Oceana Walthall, MD

## 2014-09-13 NOTE — Patient Instructions (Signed)
Use the tessalon perles as needed for cough. You cal also use the cough syrup, but do not use it when you need to drive.  I agree that you likely have a viral illness (a cold). Continue to use OTC medications as needed and drink plenty of fluids Please let me know if you are not feeling better in the next 2-3 days.

## 2015-07-10 ENCOUNTER — Encounter (HOSPITAL_COMMUNITY): Payer: Self-pay | Admitting: Emergency Medicine

## 2015-07-10 ENCOUNTER — Emergency Department (HOSPITAL_COMMUNITY)
Admission: EM | Admit: 2015-07-10 | Discharge: 2015-07-11 | Disposition: A | Discharge status: Home / Self Care | Payer: 59 | Attending: Emergency Medicine | Admitting: Emergency Medicine

## 2015-07-10 ENCOUNTER — Emergency Department (HOSPITAL_COMMUNITY): Payer: 59

## 2015-07-10 DIAGNOSIS — Z8659 Personal history of other mental and behavioral disorders: Secondary | ICD-10-CM | POA: Insufficient documentation

## 2015-07-10 DIAGNOSIS — Y9301 Activity, walking, marching and hiking: Secondary | ICD-10-CM | POA: Insufficient documentation

## 2015-07-10 DIAGNOSIS — J45909 Unspecified asthma, uncomplicated: Secondary | ICD-10-CM | POA: Diagnosis not present

## 2015-07-10 DIAGNOSIS — Y998 Other external cause status: Secondary | ICD-10-CM | POA: Insufficient documentation

## 2015-07-10 DIAGNOSIS — Y92009 Unspecified place in unspecified non-institutional (private) residence as the place of occurrence of the external cause: Secondary | ICD-10-CM | POA: Insufficient documentation

## 2015-07-10 DIAGNOSIS — W540XXA Bitten by dog, initial encounter: Secondary | ICD-10-CM | POA: Diagnosis not present

## 2015-07-10 DIAGNOSIS — Z23 Encounter for immunization: Secondary | ICD-10-CM | POA: Diagnosis not present

## 2015-07-10 DIAGNOSIS — Z79899 Other long term (current) drug therapy: Secondary | ICD-10-CM | POA: Insufficient documentation

## 2015-07-10 DIAGNOSIS — S81851A Open bite, right lower leg, initial encounter: Secondary | ICD-10-CM | POA: Diagnosis present

## 2015-07-10 DIAGNOSIS — Z7951 Long term (current) use of inhaled steroids: Secondary | ICD-10-CM | POA: Diagnosis not present

## 2015-07-10 NOTE — ED Notes (Signed)
Pt reports dog bite from neighbors dog, states she knows them but is unaware of status of dog's vaccinations.

## 2015-07-10 NOTE — ED Notes (Signed)
Pt. reports dog bite at right calf this evening , presents with superficial skin abrasion and small puncture wound at right calf . No information on dog's immunization.

## 2015-07-11 MED ORDER — TETANUS-DIPHTH-ACELL PERTUSSIS 5-2.5-18.5 LF-MCG/0.5 IM SUSP
0.5000 mL | Freq: Once | INTRAMUSCULAR | Status: AC
  Administered 2015-07-11: 0.5 mL via INTRAMUSCULAR
  Filled 2015-07-11: qty 0.5

## 2015-07-11 MED ORDER — NAPROXEN 250 MG PO TABS: 250.0000 mg | ORAL_TABLET | Freq: Two times a day (BID) | ORAL | Status: DC

## 2015-07-11 MED ORDER — ACETAMINOPHEN 325 MG PO TABS
650.0000 mg | ORAL_TABLET | Freq: Once | ORAL | Status: AC
  Administered 2015-07-11: 650 mg via ORAL
  Filled 2015-07-11: qty 2

## 2015-07-11 NOTE — ED Notes (Signed)
Pt given extensive instructions to follow up with neighbor or about vaccination status of dog, informed she needs to be seen again if dog is not up to date on rabies vaccinations. Informed to follow up streaking, pus, other signs of infection present. Pt verbalized understanding. Pt left at this time with all belongings.

## 2015-07-11 NOTE — ED Provider Notes (Signed)
CSN: [id number]     Arrival date & time 07/10/15  2316 History   First MD Initiated Contact with Patient 07/10/15 2347     Chief Complaint  Patient presents with  . Animal Bite   Loretta Carter is a 24 y.o. female who presents to the emergency department after she was bitten by her neighbors dog earlier today. The patient reports she is walking by her neighbor's house with her dog when the neighbor's dog came out after her. She is unsure of the dog's vaccination status. Patient complains of dog bite to her right posterior calf. She complains of 3 out of 10 pain. She has taken nothing for treatment today. She denies any numbness, tingling, weakness per denies any discharge from them. She denies any fevers or chills. Unsure of her last tetanus shot.  (Consider location/radiation/quality/duration/timing/severity/associated sxs/prior Treatment) HPI  Past Medical History  Diagnosis Date  . Asthma   . Depression    History reviewed. No pertinent past surgical history. Family History  Problem Relation Age of Onset  . Stroke Maternal Grandfather   . Hypertension Maternal Grandfather   . Hypertension Father    History  Substance Use Topics  . Smoking status: Never Smoker   . Smokeless tobacco: Never Used  . Alcohol Use: No   OB History    Gravida Para Term Preterm AB TAB SAB Ectopic Multiple Living   0         0     Review of Systems  Constitutional: Negative for fever and chills.  Musculoskeletal: Negative for myalgias and gait problem.  Skin: Positive for wound. Negative for color change and rash.  Neurological: Negative for weakness and numbness.      Allergies  Review of patient's allergies indicates no known allergies.  Home Medications   Prior to Admission medications   Medication Sig Start Date End Date Taking? Authorizing Provider  benzonatate (TESSALON) 100 MG capsule Take 1 capsule (100 mg total) by mouth 3 (three) times daily as needed for cough. 09/13/14   Pearline Cables, MD  cetirizine (ZYRTEC) 10 MG tablet Take 1 tablet (10 mg total) by mouth daily. 02/08/13   Collene Gobble, MD  estradiol (ESTRACE) 1 MG tablet Take 1 tablet (1 mg total) by mouth daily. 10/02/12   Henreitta Leber, PA-C  etonogestrel (NEXPLANON) 68 MG IMPL implant Inject 1 each into the skin once.    Historical Provider, MD  fluticasone (FLONASE) 50 MCG/ACT nasal spray Place 2 sprays into the nose daily. 02/08/13   Collene Gobble, MD  HYDROcodone-homatropine [facility]) 5-1.5 MG/5ML syrup Take 5 mLs by mouth every 8 (eight) hours as needed for cough. 09/13/14   Pearline Cables, MD  Multiple Vitamin (MULTIVITAMIN) tablet Take 1 tablet by mouth daily.    Historical Provider, MD  naproxen (NAPROSYN) 250 MG tablet Take 1 tablet (250 mg total) by mouth 2 (two) times daily with a meal. 07/11/15   Everlene Farrier, PA-C   BP 127/74 mmHg  Pulse 82  Temp(Src) 97.7 F (36.5 C) (Oral)  Resp 16  Ht 5\' 3"  (1.6 m)  Wt 105 lb (47.628 kg)  BMI 18.60 kg/m2  SpO2 100%  LMP 07/06/2015 Physical Exam  Constitutional: She appears well-developed and well-nourished. No distress.  Nontoxic appearing.  HENT:  Head: Normocephalic and atraumatic.  Eyes: Right eye exhibits no discharge. Left eye exhibits no discharge.  Cardiovascular: Normal rate, regular rhythm and intact distal pulses.   Bilateral dorsalis pedis and posterior tibialis  pulses are intact.  Pulmonary/Chest: Effort normal. No respiratory distress.  Musculoskeletal: Normal range of motion. She exhibits no edema or tenderness.  The patient is able to ambulate without difficulty or assistance.  Neurological: She is alert. Coordination normal.  Sensation is intact her bilateral lower extremities.  Skin: Skin is warm and dry. No rash noted. She is not diaphoretic. No erythema. No pallor.  The patient has a small 0.5 cm abrasion to the posterior aspect of her right calf. There is no evidence of puncture wound. There is no evidence of foreign body. No calf  edema or tenderness. No overlying or surrounding erythema. No discharge.  Psychiatric: She has a normal mood and affect. Her behavior is normal.  Nursing note and vitals reviewed.   ED Course  Procedures (including critical care time) Labs Review Labs Reviewed - No data to display  Imaging Review Dg Tibia/fibula Right  07/11/2015   CLINICAL DATA:  Dog bite  EXAM: RIGHT TIBIA AND FIBULA - 2 VIEW  COMPARISON:  None.  FINDINGS: Negative for fracture or dislocation. There is a small amount of radiopaque material at the posterior calf, remote from the indicated location of the puncture wound.  IMPRESSION: No bony injury. Small amount of radiopaque material in the mid calf posteriorly, apparently remote from the acute puncture wound.   Electronically Signed   By: Ellery Plunk M.D.   On: 07/11/2015 00:01     EKG Interpretation None      Filed Vitals:   07/10/15 2321  BP: 127/74  Pulse: 82  Temp: 97.7 F (36.5 C)  TempSrc: Oral  Resp: 16  Height: 5\' 3"  (1.6 m)  Weight: 105 lb (47.628 kg)  SpO2: 100%     MDM   Meds given in ED:  Medications  Tdap (BOOSTRIX) injection 0.5 mL (not administered)  acetaminophen (TYLENOL) tablet 650 mg (not administered)    New Prescriptions   NAPROXEN (NAPROSYN) 250 MG TABLET    Take 1 tablet (250 mg total) by mouth 2 (two) times daily with a meal.    Final diagnoses:  Dog bite of lower leg, right, initial encounter   This is a 24 year old female who presents to the emergency department after she was bitten on her right calf by her neighbor's dog. On exam the patient is afebrile nontoxic appearing. The patient does have a small 0.5 cm abrasion to her right posterior calf. There is no evidence of foreign body or puncture wound. There is no overlying or surrounding erythema. There is no discharge. X-ray indicates no bony injury. It did indicate a small amount of radiopaque material in the mid calf but this is not the area of her bite. I discussed  with the patient about rabies vaccination. She opted to seek the dog's vaccination status and/or have a quantity by animal control prior to receiving the rabies vaccine today. I advised her if she is unable to do that she needs to return within 72 hours to have the rabies vaccination. She verbalizes understanding and agreement with this plan. As this is a low risk bite with no puncture wound, no foreign body and a low risk site I do not feel this patient needs to be on an abx at this time. I discussed wound treatment and wound management. I did advise her of signs of infection and return precautions. Patient's tetanus was updated in the ED. Wound was cleaned and dressed. I advised the patient to follow-up with their primary care provider this week. I  advised the patient to return to the emergency department with new or worsening symptoms or new concerns. The patient verbalized understanding and agreement with plan.        Everlene Farrier, PA-C 07/11/15 1610  Mirian Mo, MD 07/14/15 [phone]

## 2015-07-11 NOTE — Discharge Instructions (Signed)
Please follow-up with the dog's owner and confirm its vaccination status and/or have the animal quarantined by animal control. If you're unable to do this she needs to return immediately to have rabies vaccination. Please watch for signs of infection as we discussed.  Animal Bite An animal bite can result in a scratch on the skin, deep open cut, puncture of the skin, crush injury, or tearing away of the skin or a body part. Dogs are responsible for most animal bites. Children are bitten more often than adults. An animal bite can range from very mild to more serious. A small bite from your house pet is no cause for alarm. However, some animal bites can become infected or injure a bone or other tissue. You must seek medical care if:  The skin is broken and bleeding does not slow down or stop after 15 minutes.  The puncture is deep and difficult to clean (such as a cat bite).  Pain, warmth, redness, or pus develops around the wound.  The bite is from a stray animal or rodent. There may be a risk of rabies infection.  The bite is from a snake, raccoon, skunk, fox, coyote, or bat. There may be a risk of rabies infection.  The person bitten has a chronic illness such as diabetes, liver disease, or cancer, or the person takes medicine that lowers the immune system.  There is concern about the location and severity of the bite. It is important to clean and protect an animal bite wound right away to prevent infection. Follow these steps:  Clean the wound with plenty of water and soap.  Apply an antibiotic cream.  Apply gentle pressure over the wound with a clean towel or gauze to slow or stop bleeding.  Elevate the affected area above the heart to help stop any bleeding.  Seek medical care. Getting medical care within 8 hours of the animal bite leads to the best possible outcome. DIAGNOSIS  Your caregiver will most likely:  Take a detailed history of the animal and the bite injury.  Perform a  wound exam.  Take your medical history. Blood tests or X-rays may be performed. Sometimes, infected bite wounds are cultured and sent to a lab to identify the infectious bacteria.  TREATMENT  Medical treatment will depend on the location and type of animal bite as well as the patient's medical history. Treatment may include:  Wound care, such as cleaning and flushing the wound with saline solution, bandaging, and elevating the affected area.  Antibiotics.  Tetanus immunization.  Rabies immunization.  Leaving the wound open to heal. This is often done with animal bites, due to the high risk of infection. However, in certain cases, wound closure with stitches, wound adhesive, skin adhesive strips, or staples may be used. Infected bites that are left untreated may require intravenous (IV) antibiotics and surgical treatment in the hospital. HOME CARE INSTRUCTIONS  Follow your caregiver's instructions for wound care.  Take all medicines as directed.  If your caregiver prescribes antibiotics, take them as directed. Finish them even if you start to feel better.  Follow up with your caregiver for further exams or immunizations as directed. You may need a tetanus shot if:  You cannot remember when you had your last tetanus shot.  You have never had a tetanus shot.  The injury broke your skin. If you get a tetanus shot, your arm may swell, get red, and feel warm to the touch. This is common and not a  problem. If you need a tetanus shot and you choose not to have one, there is a rare chance of getting tetanus. Sickness from tetanus can be serious. SEEK MEDICAL CARE IF:  You notice warmth, redness, soreness, swelling, pus discharge, or a bad smell coming from the wound.  You have a red line on the skin coming from the wound.  You have a fever, chills, or a general ill feeling.  You have nausea or vomiting.  You have continued or worsening pain.  You have trouble moving the injured  part.  You have other questions or concerns. MAKE SURE YOU:  Understand these instructions.  Will watch your condition.  Will get help right away if you are not doing well or get worse. Document Released: 08/14/2011 Document Revised: 02/18/2012 Document Reviewed: 08/14/2011 [facility] Patient Information 2015 [place], [place]. This information is not intended to replace advice given to you by your health care provider. Make sure you discuss any questions you have with your health care provider.

## 2015-07-13 ENCOUNTER — Encounter (HOSPITAL_COMMUNITY): Payer: Self-pay | Admitting: Emergency Medicine

## 2015-07-13 ENCOUNTER — Emergency Department (HOSPITAL_COMMUNITY)
Admission: EM | Admit: 2015-07-13 | Discharge: 2015-07-13 | Disposition: A | Discharge status: Home / Self Care | Payer: 59 | Attending: Emergency Medicine | Admitting: Emergency Medicine

## 2015-07-13 DIAGNOSIS — Z8659 Personal history of other mental and behavioral disorders: Secondary | ICD-10-CM | POA: Insufficient documentation

## 2015-07-13 DIAGNOSIS — T7840XA Allergy, unspecified, initial encounter: Secondary | ICD-10-CM

## 2015-07-13 DIAGNOSIS — J45909 Unspecified asthma, uncomplicated: Secondary | ICD-10-CM | POA: Insufficient documentation

## 2015-07-13 DIAGNOSIS — Z87828 Personal history of other (healed) physical injury and trauma: Secondary | ICD-10-CM | POA: Insufficient documentation

## 2015-07-13 DIAGNOSIS — Y929 Unspecified place or not applicable: Secondary | ICD-10-CM | POA: Insufficient documentation

## 2015-07-13 DIAGNOSIS — Z791 Long term (current) use of non-steroidal anti-inflammatories (NSAID): Secondary | ICD-10-CM | POA: Diagnosis not present

## 2015-07-13 DIAGNOSIS — Z793 Long term (current) use of hormonal contraceptives: Secondary | ICD-10-CM | POA: Insufficient documentation

## 2015-07-13 DIAGNOSIS — Y939 Activity, unspecified: Secondary | ICD-10-CM | POA: Diagnosis not present

## 2015-07-13 DIAGNOSIS — Z79899 Other long term (current) drug therapy: Secondary | ICD-10-CM | POA: Diagnosis not present

## 2015-07-13 DIAGNOSIS — Z7951 Long term (current) use of inhaled steroids: Secondary | ICD-10-CM | POA: Diagnosis not present

## 2015-07-13 DIAGNOSIS — X58XXXA Exposure to other specified factors, initial encounter: Secondary | ICD-10-CM | POA: Diagnosis not present

## 2015-07-13 DIAGNOSIS — Y999 Unspecified external cause status: Secondary | ICD-10-CM | POA: Insufficient documentation

## 2015-07-13 DIAGNOSIS — R21 Rash and other nonspecific skin eruption: Secondary | ICD-10-CM | POA: Diagnosis present

## 2015-07-13 MED ORDER — PREDNISONE 20 MG PO TABS: 40.0000 mg | ORAL_TABLET | Freq: Every day | ORAL | Status: DC

## 2015-07-13 MED ORDER — DIPHENHYDRAMINE HCL 25 MG PO CAPS
25.0000 mg | ORAL_CAPSULE | Freq: Once | ORAL | Status: AC
  Administered 2015-07-13: 25 mg via ORAL
  Filled 2015-07-13: qty 1

## 2015-07-13 MED ORDER — PREDNISONE 20 MG PO TABS
40.0000 mg | ORAL_TABLET | Freq: Once | ORAL | Status: AC
  Administered 2015-07-13: 40 mg via ORAL
  Filled 2015-07-13: qty 2

## 2015-07-13 NOTE — ED Provider Notes (Signed)
CSN: [id number]     Arrival date & time 07/13/15  0301 History   This chart was scribed for Gwyneth Sprout, MD by Arlan Organ, ED Scribe. This patient was seen in room B17C/B17C and the patient's care was started 3:14 AM.   Chief Complaint  Patient presents with  . Urticaria   The history is provided by the patient. No language interpreter was used.    HPI Comments: Loretta Carter is a 24 y.o. female without any pertinent past medical history who presents to the Emergency Department complaining of a constant, ongoing generalized pruritis rash to the BUE, BLE, torso, posterior neck, and face 2-3 days; worsened at midnight this evening. Pt recently sustained a dog bite 2 days ago but states she noted the rash prior to bite. OTC topical Cortisone ointment attempted prior to arrival without any improvement for symptoms. She denies any trouble swallowing, shortness of breath, intraoral swelling, itchiness to lips/mouth. No new skin care products, medications, or detergents. No known allergies to medications.  Past Medical History  Diagnosis Date  . Asthma   . Depression    No past surgical history on file. Family History  Problem Relation Age of Onset  . Stroke Maternal Grandfather   . Hypertension Maternal Grandfather   . Hypertension Father    History  Substance Use Topics  . Smoking status: Never Smoker   . Smokeless tobacco: Never Used  . Alcohol Use: No   OB History    Gravida Para Term Preterm AB TAB SAB Ectopic Multiple Living   0         0     Review of Systems  Constitutional: Negative for fever and chills.  HENT: Negative for trouble swallowing.   Respiratory: Negative for cough, shortness of breath and wheezing.   Gastrointestinal: Negative for nausea, vomiting and abdominal pain.  Skin: Positive for rash.  Psychiatric/Behavioral: Negative for confusion.  All other systems reviewed and are negative.     Allergies  Review of patient's allergies indicates no  known allergies.  Home Medications   Prior to Admission medications   Medication Sig Start Date End Date Taking? Authorizing Provider  benzonatate (TESSALON) 100 MG capsule Take 1 capsule (100 mg total) by mouth 3 (three) times daily as needed for cough. 09/13/14   Pearline Cables, MD  cetirizine (ZYRTEC) 10 MG tablet Take 1 tablet (10 mg total) by mouth daily. 02/08/13   Collene Gobble, MD  estradiol (ESTRACE) 1 MG tablet Take 1 tablet (1 mg total) by mouth daily. 10/02/12   Henreitta Leber, PA-C  etonogestrel (NEXPLANON) 68 MG IMPL implant Inject 1 each into the skin once.    Historical Provider, MD  fluticasone (FLONASE) 50 MCG/ACT nasal spray Place 2 sprays into the nose daily. 02/08/13   Collene Gobble, MD  HYDROcodone-homatropine [facility]) 5-1.5 MG/5ML syrup Take 5 mLs by mouth every 8 (eight) hours as needed for cough. 09/13/14   Pearline Cables, MD  Multiple Vitamin (MULTIVITAMIN) tablet Take 1 tablet by mouth daily.    Historical Provider, MD  naproxen (NAPROSYN) 250 MG tablet Take 1 tablet (250 mg total) by mouth 2 (two) times daily with a meal. 07/11/15   Everlene Farrier, PA-C   Triage Vitals: BP 136/71 mmHg  Pulse 87  Temp(Src) 98.4 F (36.9 C) (Oral)  Resp 16  Ht 5\' 3"  (1.6 m)  Wt 105 lb (47.628 kg)  BMI 18.60 kg/m2  SpO2 100%  LMP 07/06/2015   Physical Exam  Constitutional: She is oriented to person, place, and time. She appears well-developed and well-nourished. No distress.  HENT:  Head: Normocephalic and atraumatic.  No uvular swelling No facial swelling  Eyes: EOM are normal.  Neck: Normal range of motion.  Cardiovascular: Normal rate, regular rhythm and normal heart sounds.   Pulmonary/Chest: Effort normal and breath sounds normal. She has no wheezes.  Abdominal: Soft. She exhibits no distension. There is no tenderness.  Musculoskeletal: Normal range of motion.  Neurological: She is alert and oriented to person, place, and time.  Skin: Skin is warm and dry. Rash  noted.  Fine, patchy, nonconfluent  maculopapular rash to neck, torso, and bilateral upper extremities  Psychiatric: She has a normal mood and affect. Judgment normal.  Nursing note and vitals reviewed.   ED Course  Procedures (including critical care time)  DIAGNOSTIC STUDIES: Oxygen Saturation is 100% on RA, Normal by my interpretation.    COORDINATION OF CARE: 3:20 AM- Will give Prednisone and Benadryl. Discussed treatment plan with pt at bedside and pt agreed to plan.     Labs Review Labs Reviewed - No data to display  Imaging Review No results found.   EKG Interpretation None      MDM   Final diagnoses:  Allergic reaction, initial encounter    Patient with evidence of an allergic reaction to an unknown allergen. She has no airway compromise or facial, lip, tongue or uvular swelling. She has only tried topical hydrocortisone cream. Patient given Benadryl and prednisone.   5:28 AM Pt much improved and d/ced home. I personally performed the services described in this documentation, which was scribed in my presence.  The recorded information has been reviewed and considered.   Gwyneth Sprout, MD 07/13/15 [phone]

## 2015-07-13 NOTE — ED Notes (Signed)
Pt reports itching and rash on BUE, BLE, abdomen, posterior neck, cheeks, beginning yesterday, rash worsening around 2300.  Pt denies trouble swallowing, difficulty breathing, itching to lips, mouth.  Pt denies change in skin care products, meds, detergents.  NAD noted at this time, resp e/u.

## 2015-07-13 NOTE — ED Notes (Signed)
This RN advised Dr. PlunkAnitra Lautht will be driving self home.  Per Dr. [name] okay for pt to receive benadryl.

## 2015-07-13 NOTE — Discharge Instructions (Signed)

## 2015-10-24 ENCOUNTER — Ambulatory Visit (INDEPENDENT_AMBULATORY_CARE_PROVIDER_SITE_OTHER): Payer: 59 | Admitting: Physician Assistant

## 2015-10-24 VITALS — BP 102/68 | HR 78 | Temp 98.5°F | Resp 16 | Ht 63.0 in | Wt 113.0 lb

## 2015-10-24 DIAGNOSIS — H66002 Acute suppurative otitis media without spontaneous rupture of ear drum, left ear: Secondary | ICD-10-CM | POA: Diagnosis not present

## 2015-10-24 MED ORDER — AMOXICILLIN 875 MG PO TABS: 875.0000 mg | ORAL_TABLET | Freq: Two times a day (BID) | ORAL | Status: AC

## 2015-10-24 NOTE — Progress Notes (Signed)
   10/25/2015 9:43 AM   DOB: 02/19/1991 / MRN: [id number]  SUBJECTIVE:  Loretta Carter is an insistent 24 y.o. female presenting for for left sided ear pain and decreased hearing that has been gradually worsening for 4 days.  Associates some right sided tinnitus.  Denies fever, otorrhea, and external ear pain.  Denies sick contacts.  Has not tried anything for this problem.    She has No Known Allergies.   She  has a past medical history of Asthma and Depression.    She  reports that she has never smoked. She has never used smokeless tobacco. She reports that she does not drink alcohol or use illicit drugs. She  has no sexual activity history on file. The patient  has no past surgical history on file.  Her family history includes Hypertension in her father and maternal grandfather; Stroke in her maternal grandfather.  Review of Systems  Constitutional: Negative for fever and chills.  HENT: Positive for ear pain and tinnitus. Negative for congestion and ear discharge.   Respiratory: Negative for shortness of breath.   Cardiovascular: Negative for chest pain.  Gastrointestinal: Negative for nausea and abdominal pain.  Genitourinary: Negative.   Skin: Negative for rash.  Neurological: Negative for dizziness and headaches.    Problem list and medications reviewed and updated by myself where necessary, and exist elsewhere in the encounter.   OBJECTIVE:  BP 102/68 mmHg  Pulse 78  Temp(Src) 98.5 F (36.9 C)  Resp 16  Ht 5\' 3"  (1.6 m)  Wt 113 lb (51.256 kg)  BMI 20.02 kg/m2  SpO2 95% CrCl cannot be calculated (Patient has no serum creatinine result on file.).  Physical Exam  Constitutional: She is oriented to person, place, and time. She appears well-developed and well-nourished.  HENT:  Head: Normocephalic.  Right Ear: External ear and ear canal normal. No middle ear effusion.  Left Ear: Ear canal normal. There is drainage. No mastoid tenderness. A middle ear effusion is present.  Decreased hearing is noted.  Ears:  Mouth/Throat: No oropharyngeal exudate.  Eyes: Conjunctivae and EOM are normal. Pupils are equal, round, and reactive to light. Right eye exhibits no discharge. Left eye exhibits no discharge.  Neck: No tracheal deviation present.  Cardiovascular: Normal rate, regular rhythm and normal heart sounds.   Pulmonary/Chest: Effort normal and breath sounds normal.  Abdominal: Soft. Bowel sounds are normal.  Lymphadenopathy:    She has no cervical adenopathy.  Neurological: She is alert and oriented to person, place, and time. No cranial nerve deficit.  Skin: Skin is warm and dry.  Psychiatric: She has a normal mood and affect. Her behavior is normal. Judgment and thought content normal.    No results found for this or any previous visit (from the past 48 hour(s)).  ASSESSMENT AND PLAN  Loretta was seen today for cerumen impaction.  Diagnoses and all orders for this visit:  Acute suppurative otitis media of left ear without spontaneous rupture of tympanic membrane, recurrence not specified -     amoxicillin (AMOXIL) 875 MG tablet; Take 1 tablet (875 mg total) by mouth 2 (two) times daily.    The patient was advised to call or return to clinic if she does not see an improvement in symptoms or to seek the care of the closest emergency department if she worsens with the above plan.   Deliah BostonMichael Clark, MHS, PA-C [facility] 10/25/2015 9:43 AM

## 2015-10-24 NOTE — Progress Notes (Deleted)
   10/24/2015 7:50 PM   DOB: 02/15/1991 / MRN: [id number]  SUBJECTIVE:  Loretta Carter is a 24 y.o. female presenting for   She has No Known Allergies.   She  has a past medical history of Asthma and Depression.    She  reports that she has never smoked. She has never used smokeless tobacco. She reports that she does not drink alcohol or use illicit drugs. She  has no sexual activity history on file. The patient  has no past surgical history on file.  Her family history includes Hypertension in her father and maternal grandfather; Stroke in her maternal grandfather.  ROS  Problem list and medications reviewed and updated by myself where necessary, and exist elsewhere in the encounter.   OBJECTIVE:  BP 102/68 mmHg  Pulse 78  Temp(Src) 98.5 F (36.9 C)  Resp 16  Ht 5\' 3"  (1.6 m)  Wt 113 lb (51.256 kg)  BMI 20.02 kg/m2  SpO2 95% CrCl cannot be calculated (Patient has no serum creatinine result on file.).  Physical Exam  No results found for this or any previous visit (from the past 48 hour(s)).  ASSESSMENT AND PLAN  Loretta was seen today for cerumen impaction.  Diagnoses and all orders for this visit:  Acute suppurative otitis media of left ear without spontaneous rupture of tympanic membrane, recurrence not specified  Other orders -     amoxicillin (AMOXIL) 875 MG tablet; Take 1 tablet (875 mg total) by mouth 2 (two) times daily.    The patient was advised to call or return to clinic if she does not see an improvement in symptoms or to seek the care of the closest emergency department if she worsens with the above plan.   Deliah BostonMichael Clark, MHS, PA-C [facility] 10/24/2015 7:50 PM

## 2015-12-31 IMAGING — CR DG TIBIA/FIBULA 2V*R*
2 series · 2 of 2 positions shown · non-contrast
Comparison: None.

CLINICAL DATA: Dog bite

EXAM:
RIGHT TIBIA AND FIBULA - 2 VIEW

[tibia ap]
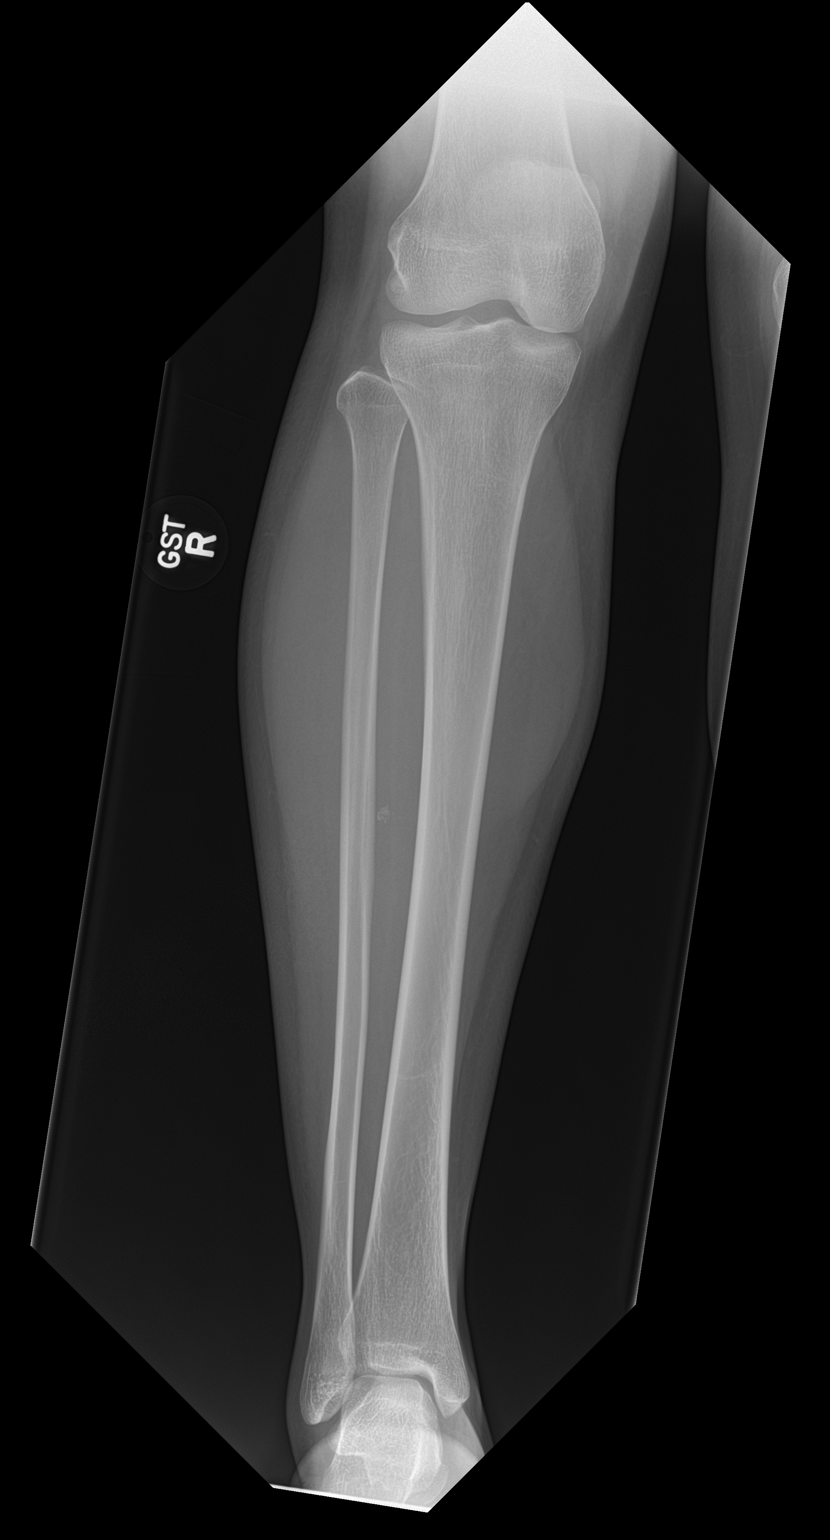

[tibia lat]
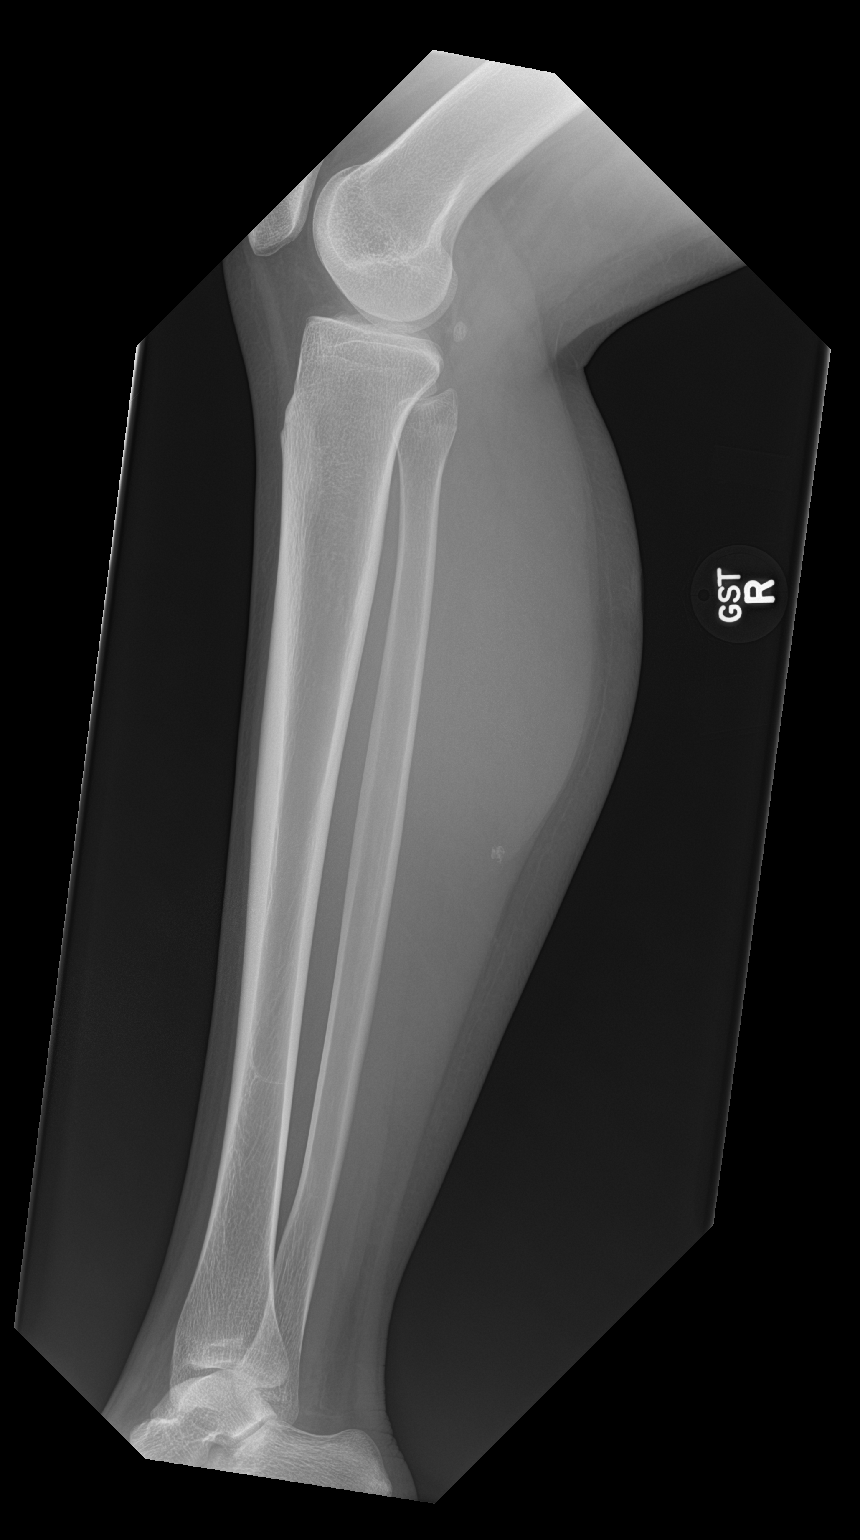

[2 of 2 positions shown; findings below may reference images not displayed]

FINDINGS: Negative for fracture or dislocation. There is a small amount of
radiopaque material at the posterior calf, remote from the indicated
location of the puncture wound.
IMPRESSION: No bony injury. Small amount of radiopaque material in the mid calf
posteriorly, apparently remote from the acute puncture wound.

## 2019-02-25 ENCOUNTER — Encounter: Payer: Self-pay | Admitting: Obstetrics and Gynecology
